# Patient Record
Sex: Female | Born: 1990 | Race: Black or African American | Hispanic: No | Marital: Single
Health system: Southern US, Community
[De-identification: ages and names within clinical notes are randomized; demographics above are authoritative.]

---

## 2020-01-14 ENCOUNTER — Emergency Department (HOSPITAL_COMMUNITY): Payer: No Typology Code available for payment source

## 2020-01-14 ENCOUNTER — Other Ambulatory Visit: Payer: Self-pay

## 2020-01-14 ENCOUNTER — Encounter (HOSPITAL_COMMUNITY): Payer: Self-pay | Admitting: Emergency Medicine

## 2020-01-14 ENCOUNTER — Emergency Department (HOSPITAL_COMMUNITY)
Admission: EM | Admit: 2020-01-14 | Discharge: 2020-01-14 | Disposition: A | Discharge status: Home / Self Care | Payer: No Typology Code available for payment source | Attending: Emergency Medicine | Admitting: Emergency Medicine

## 2020-01-14 DIAGNOSIS — S52602A Unspecified fracture of lower end of left ulna, initial encounter for closed fracture: Secondary | ICD-10-CM

## 2020-01-14 DIAGNOSIS — S59912A Unspecified injury of left forearm, initial encounter: Secondary | ICD-10-CM | POA: Diagnosis present

## 2020-01-14 MED ORDER — OXYCODONE-ACETAMINOPHEN 5-325 MG PO TABS
1.0000 | ORAL_TABLET | Freq: Once | ORAL | Status: AC
  Administered 2020-01-14: 1 via ORAL
  Filled 2020-01-14: qty 1
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
## 2020-01-14 NOTE — Progress Notes (Signed)
Orthopedic Tech Progress Note Patient Details:  Jennifer Chung October 30, 1990 [id number]  Ortho Devices Type of Ortho Device: Ace wrap, Sugartong splint Ortho Device/Splint Location: sugartong splint and sling LUE Ortho Device/Splint Interventions: Ordered, Application   Post Interventions Instructions Provided: Care of device   [name] 01/14/2020, 4:59 PM

## 2020-01-14 NOTE — Progress Notes (Signed)
Orthopedic Tech Progress Note Patient Details:  Jennifer Chung 1990/08/06 [id number]  Ortho Devices Type of Ortho Device: Ace wrap, Sugartong splint Ortho Device/Splint Location: sugartong splint and sling LUE Ortho Device/Splint Interventions: Ordered, Application   Post Interventions Instructions Provided: Care of device   [name] 01/14/2020, 2:20 PM
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
## 2020-01-14 NOTE — Discharge Instructions (Addendum)
You have a ulna fracture.  I placed you into a splint.  Please keep splint on.  you may take over-the-counter pain medications like ibuprofen and or Tylenol every 6 hours as needed.  Please follow dosing the back of bottle.  May also apply ice to the area as this can help decrease swelling and pain.  I have spoken with Dr. Dion Saucier of hand surgery who would like to see you tomorrow at his office at 9 AM.  Please go for further evaluation.  Please come back to the emergency department if you have numbness or tingling in your hand, increasing hand swelling or pain, chest pain, shortness of breath, abdominal pain, nausea, vomiting, diarrhea.

## 2021-05-03 IMAGING — CR DG HAND COMPLETE 3+V*L*
3 series · 3 of 3 positions shown · non-contrast
Comparison: None.

CLINICAL DATA: Dorsal left hand pain.

EXAM:
LEFT HAND - COMPLETE 3+ VIEW

[w hand pa left]
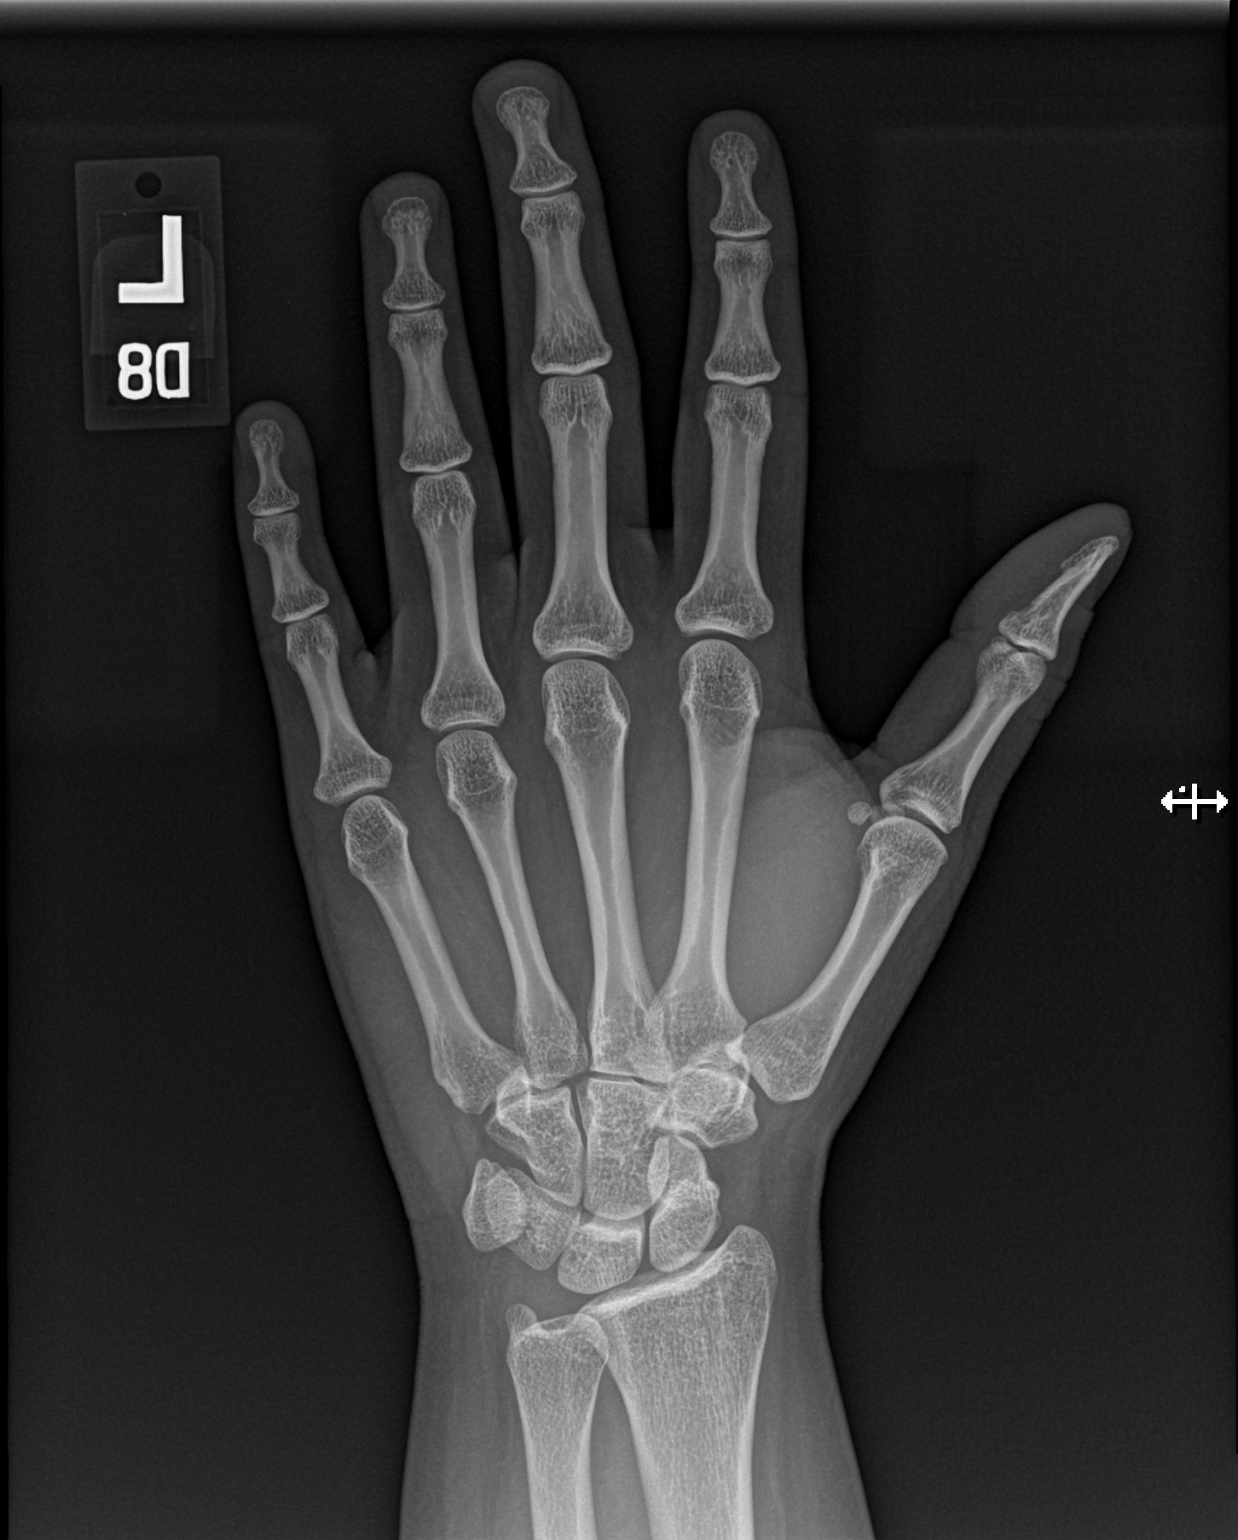

[w hand obl left]
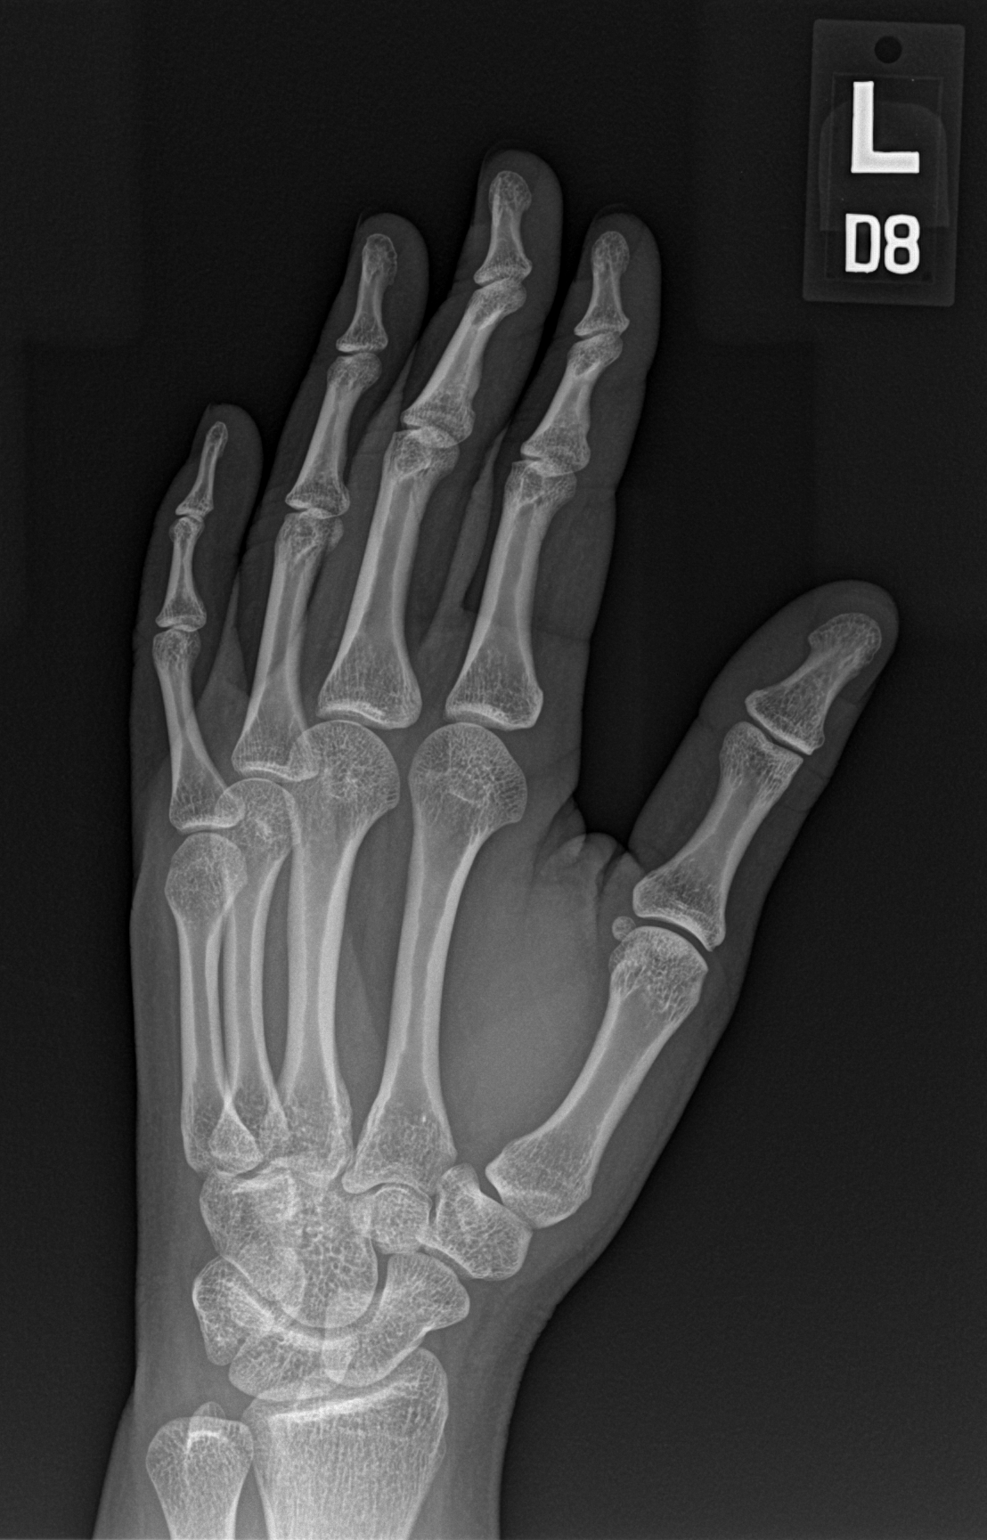

[w hand lat left]
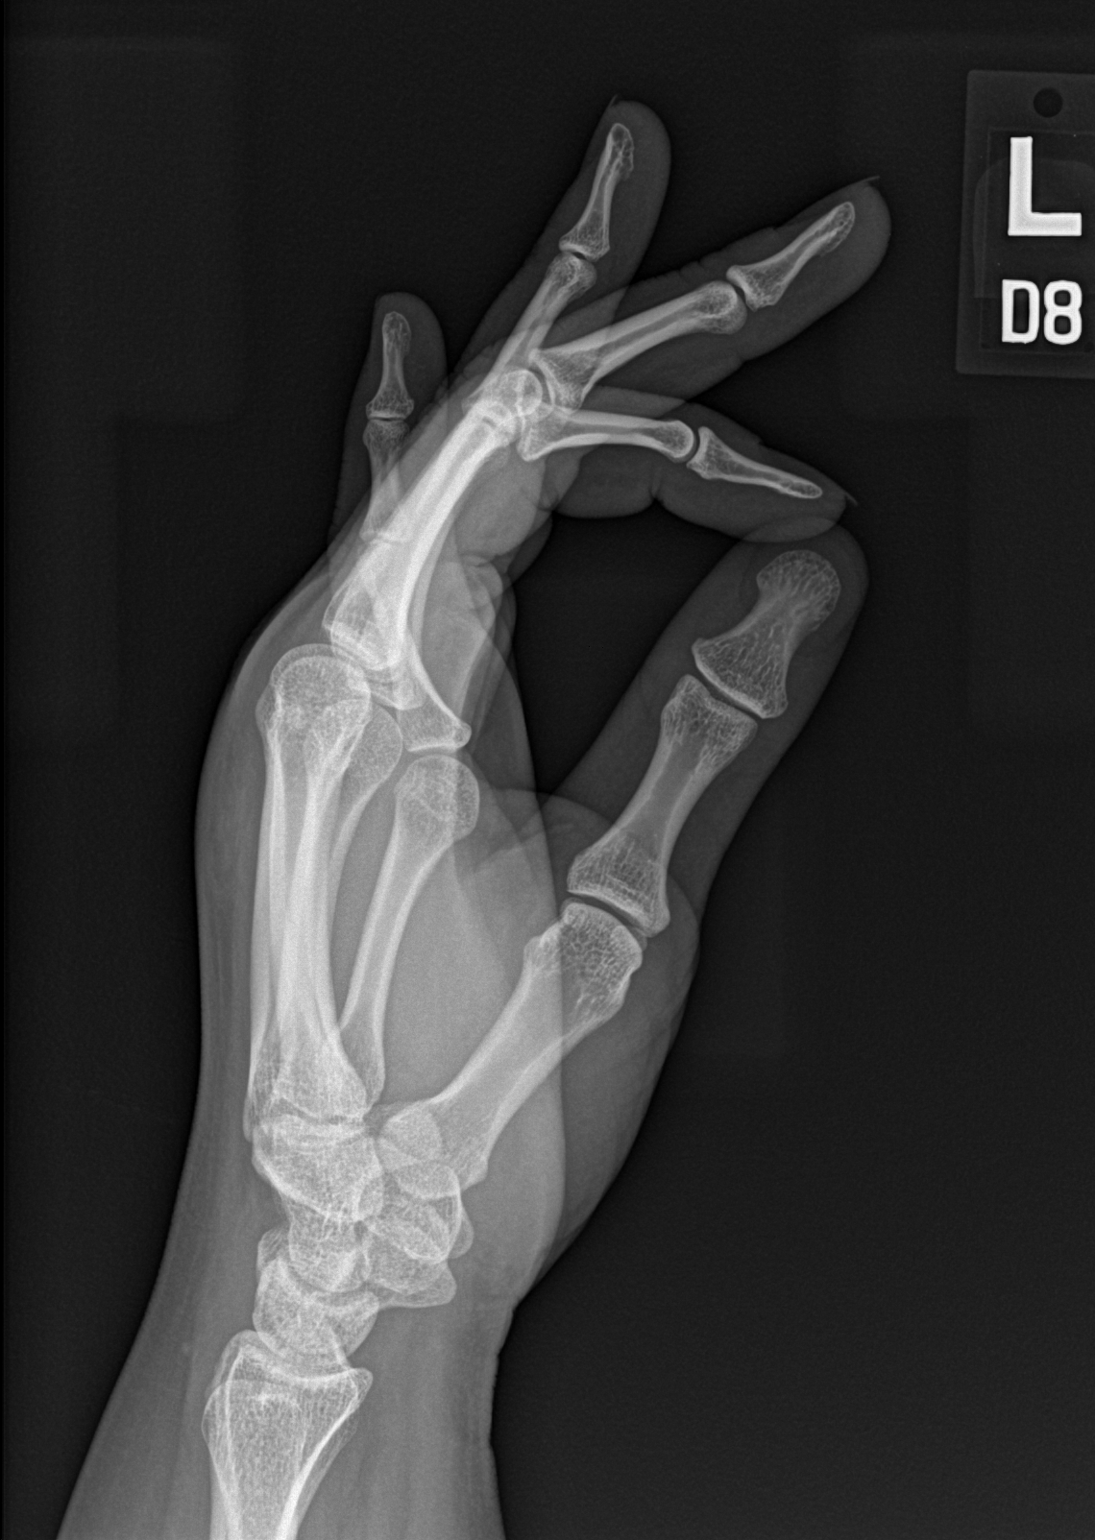

[3 of 3 positions shown; findings below may reference images not displayed]

FINDINGS: There is no evidence of fracture or dislocation. There is no
evidence of arthropathy or other focal bone abnormality. Dorsal soft
tissue swelling.
IMPRESSION: Dorsal soft tissue swelling.

No fractures seen.
